# Patient Record
Sex: Male | Born: 2000 | Race: White | Hispanic: No | Marital: Single | Smoking: Never smoker
Health system: Southern US, Community
[De-identification: ages and names within clinical notes are randomized; demographics above are authoritative.]

## PROBLEM LIST (undated history)

## (undated) DIAGNOSIS — F909 Attention-deficit hyperactivity disorder, unspecified type: Secondary | ICD-10-CM

## (undated) DIAGNOSIS — E109 Type 1 diabetes mellitus without complications: Secondary | ICD-10-CM

## (undated) DIAGNOSIS — S83281A Other tear of lateral meniscus, current injury, right knee, initial encounter: Secondary | ICD-10-CM

## (undated) HISTORY — DX: Attention-deficit hyperactivity disorder, unspecified type: F90.9

## (undated) HISTORY — DX: Type 1 diabetes mellitus without complications: E10.9

---
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
## 2019-07-11 IMAGING — CR DG HAND COMPLETE 3+V*L*
3 series · 3 of 3 positions shown · non-contrast
Comparison: None.

CLINICAL DATA: 16-year-old who injured the left hand while playing
baseball. Initial encounter.

EXAM:
LEFT HAND - COMPLETE 3+ VIEW

[pa (1 of 2)]
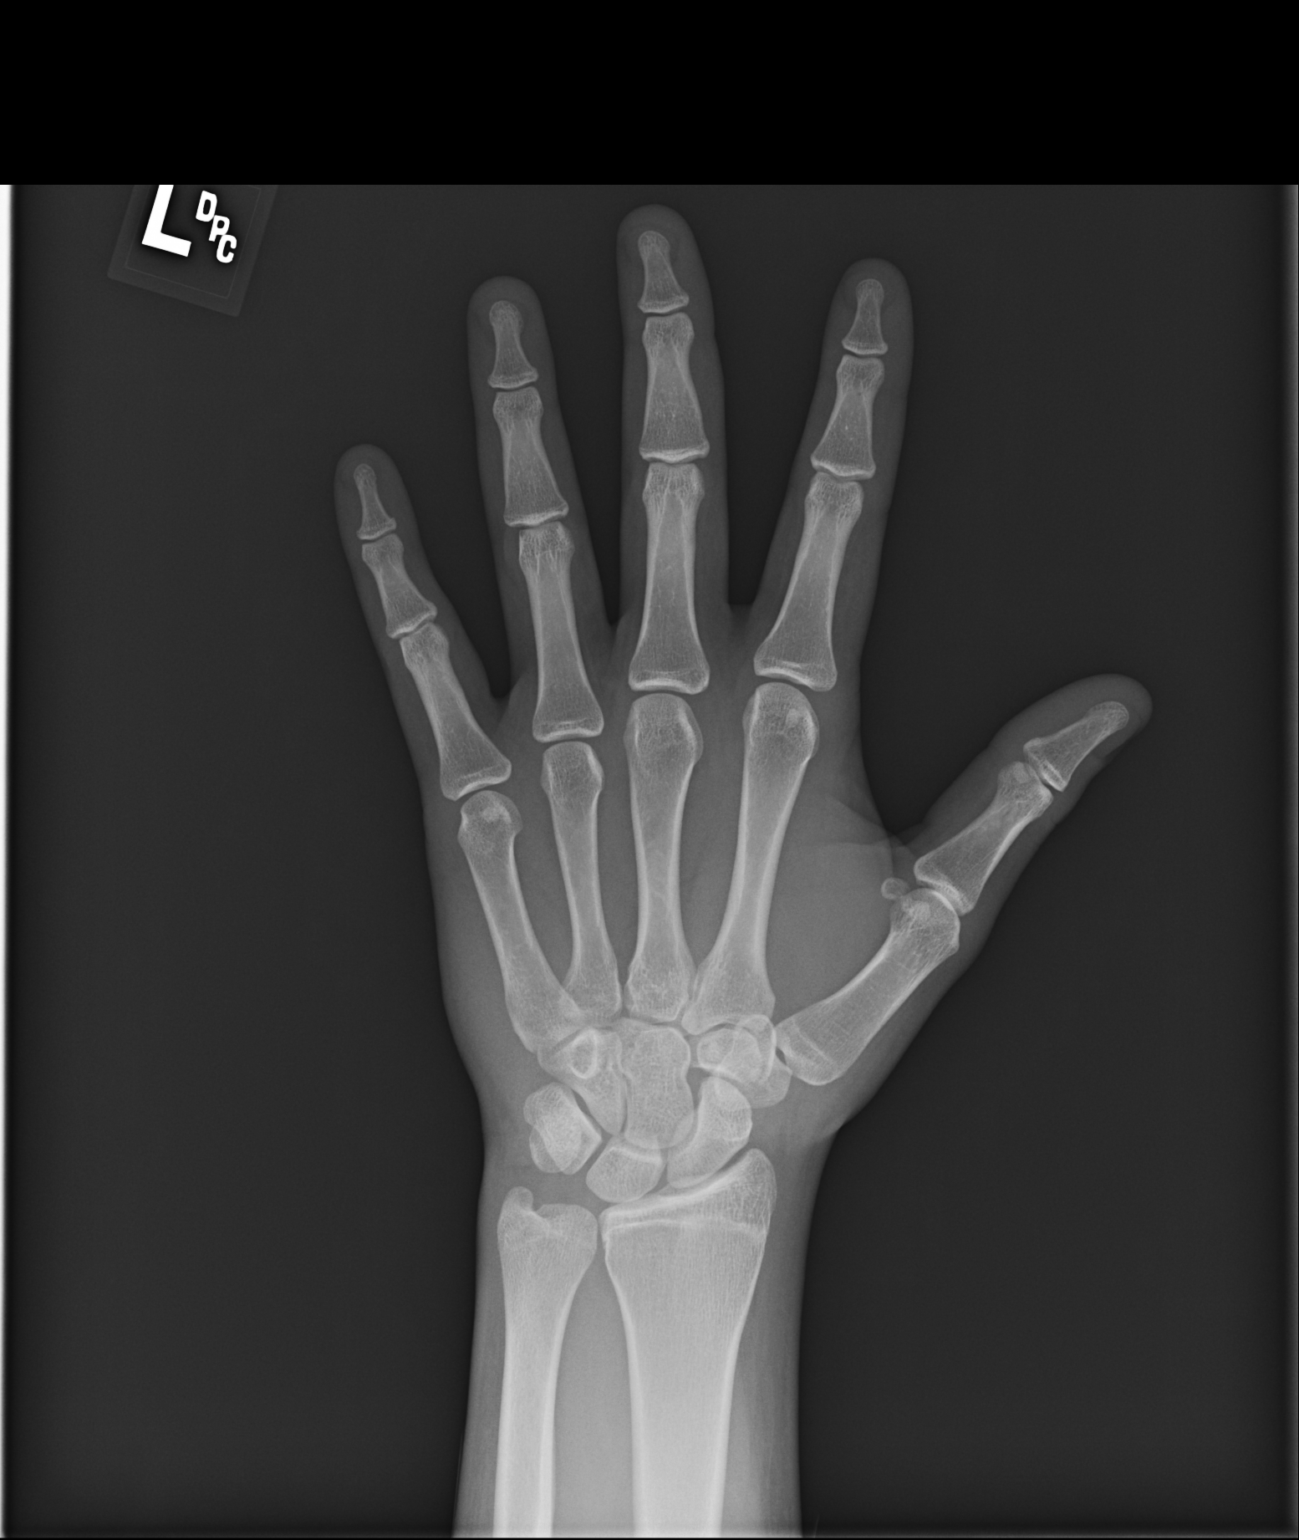

[oblique]
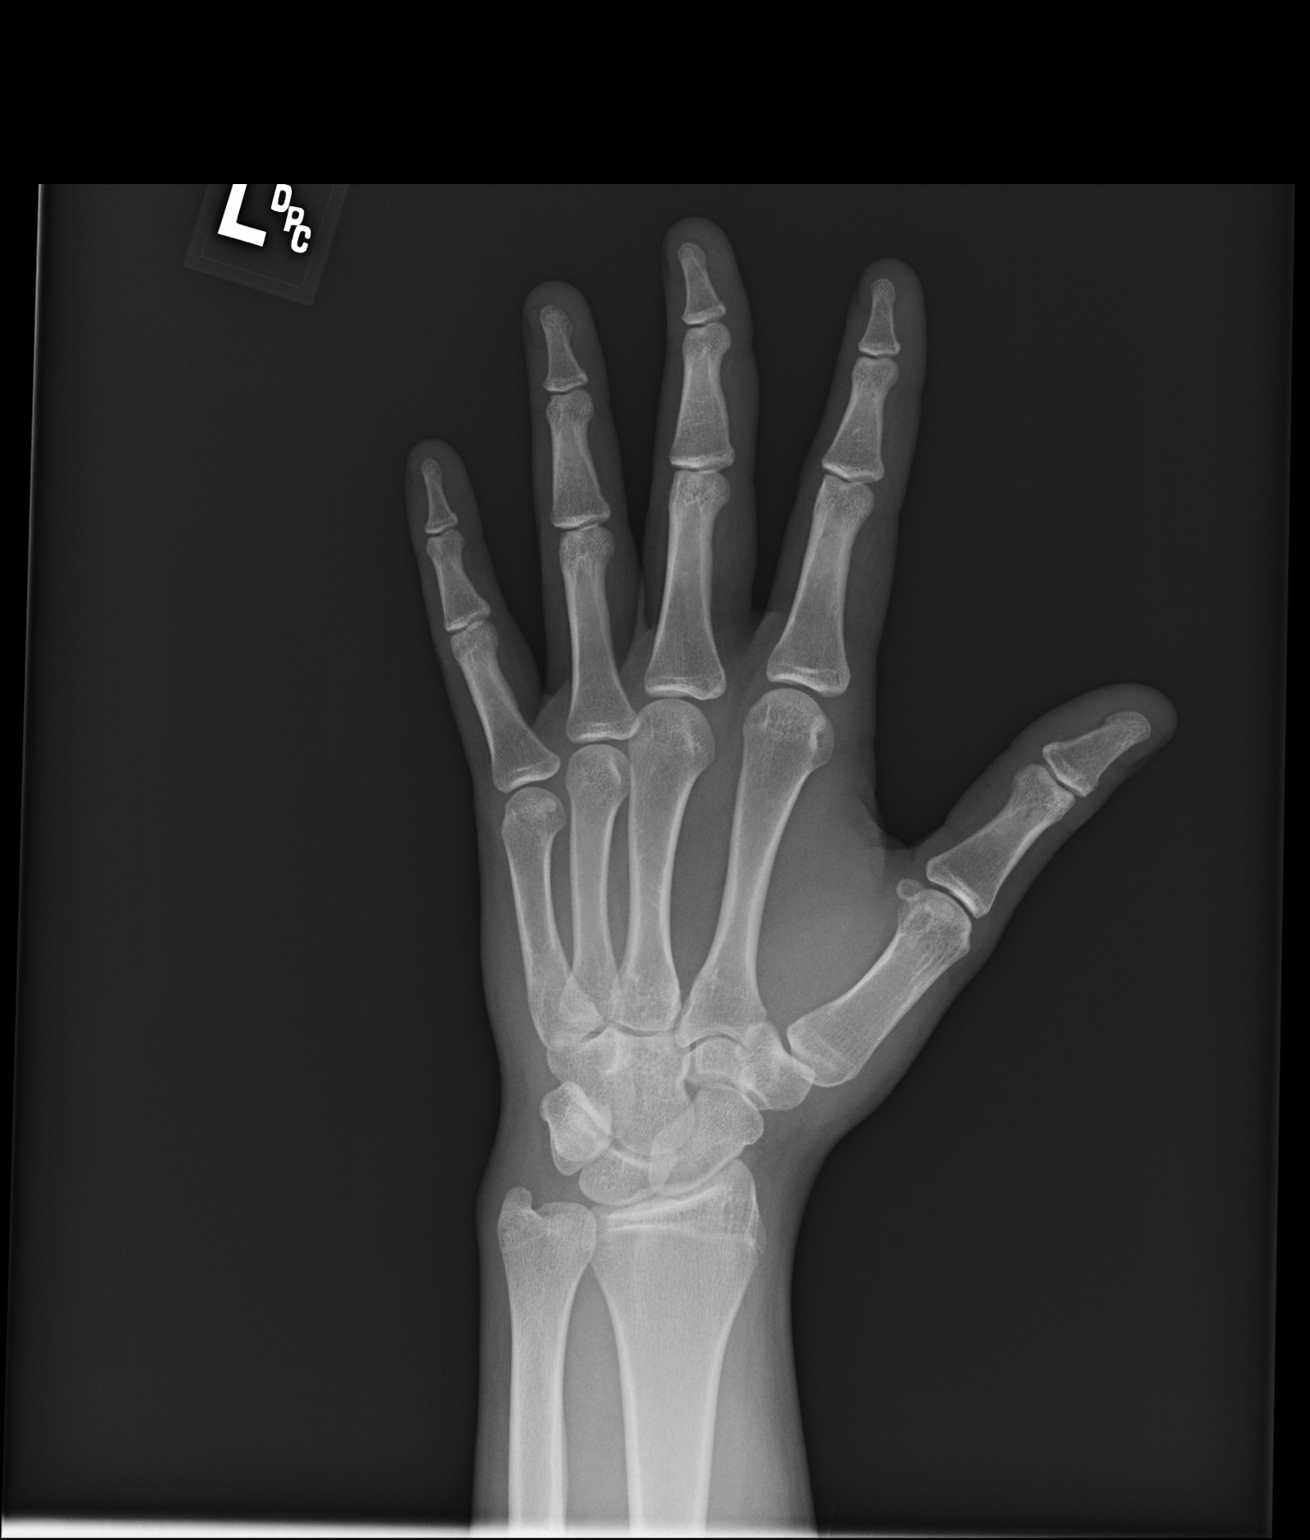

[pa (2 of 2)]
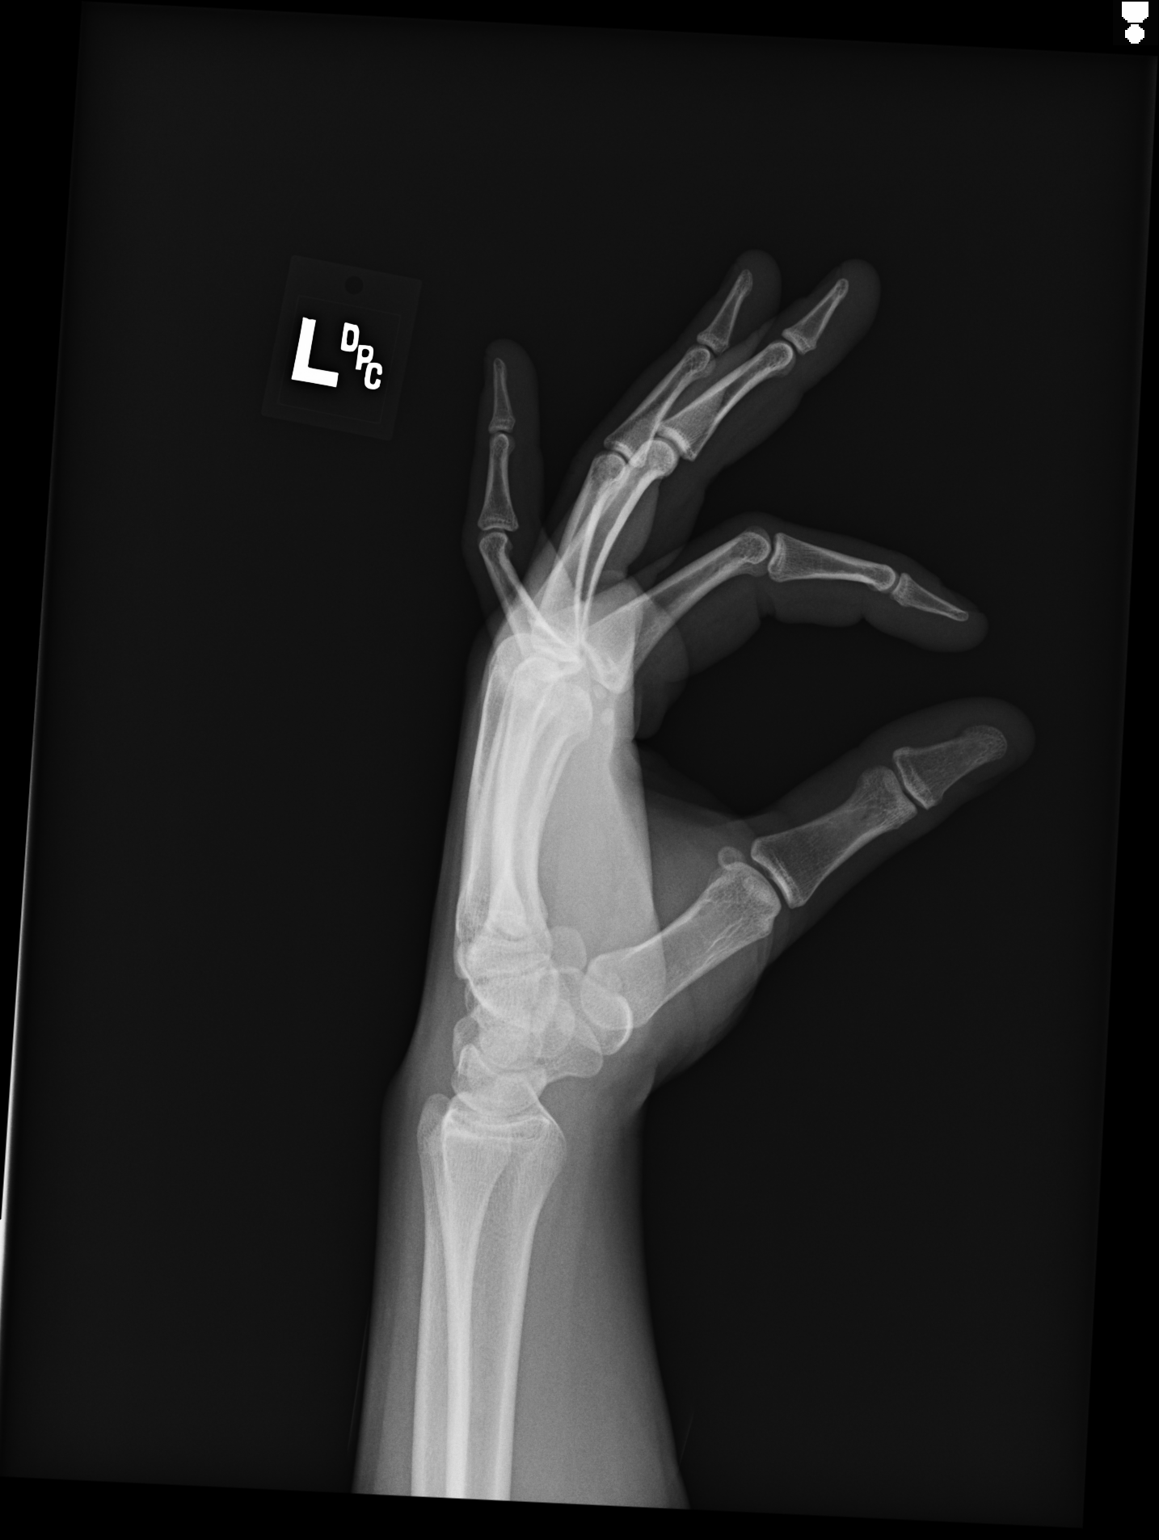

[3 of 3 positions shown; findings below may reference images not displayed]

FINDINGS: No evidence of acute fracture or dislocation. Joint spaces well
preserved. Well-preserved bone mineral density. No intrinsic osseous
abnormalities.
IMPRESSION: Normal examination.

## 2020-08-20 DIAGNOSIS — E1065 Type 1 diabetes mellitus with hyperglycemia: Secondary | ICD-10-CM | POA: Diagnosis not present

## 2020-08-20 DIAGNOSIS — Z794 Long term (current) use of insulin: Secondary | ICD-10-CM | POA: Diagnosis not present

## 2020-09-25 DIAGNOSIS — M791 Myalgia, unspecified site: Secondary | ICD-10-CM | POA: Diagnosis not present

## 2020-09-25 DIAGNOSIS — Z20822 Contact with and (suspected) exposure to covid-19: Secondary | ICD-10-CM | POA: Diagnosis not present

## 2020-10-18 DIAGNOSIS — Z20822 Contact with and (suspected) exposure to covid-19: Secondary | ICD-10-CM | POA: Diagnosis not present

## 2021-02-15 DIAGNOSIS — R509 Fever, unspecified: Secondary | ICD-10-CM | POA: Diagnosis not present

## 2021-02-15 DIAGNOSIS — J101 Influenza due to other identified influenza virus with other respiratory manifestations: Secondary | ICD-10-CM | POA: Diagnosis not present

## 2021-04-08 DIAGNOSIS — F10129 Alcohol abuse with intoxication, unspecified: Secondary | ICD-10-CM | POA: Diagnosis not present

## 2021-04-08 DIAGNOSIS — K2970 Gastritis, unspecified, without bleeding: Secondary | ICD-10-CM | POA: Diagnosis not present

## 2021-04-08 DIAGNOSIS — E1010 Type 1 diabetes mellitus with ketoacidosis without coma: Secondary | ICD-10-CM | POA: Diagnosis not present

## 2021-04-08 DIAGNOSIS — R112 Nausea with vomiting, unspecified: Secondary | ICD-10-CM | POA: Diagnosis not present

## 2021-04-08 DIAGNOSIS — Z794 Long term (current) use of insulin: Secondary | ICD-10-CM | POA: Diagnosis not present

## 2021-04-08 DIAGNOSIS — Z20822 Contact with and (suspected) exposure to covid-19: Secondary | ICD-10-CM | POA: Diagnosis not present

## 2021-04-09 DIAGNOSIS — E1010 Type 1 diabetes mellitus with ketoacidosis without coma: Secondary | ICD-10-CM | POA: Diagnosis not present

## 2021-06-19 ENCOUNTER — Encounter: Payer: Self-pay | Admitting: Nurse Practitioner

## 2021-06-19 ENCOUNTER — Other Ambulatory Visit: Payer: Self-pay

## 2021-06-19 ENCOUNTER — Ambulatory Visit (INDEPENDENT_AMBULATORY_CARE_PROVIDER_SITE_OTHER): Payer: BC Managed Care – PPO | Admitting: Nurse Practitioner

## 2021-06-19 VITALS — BP 106/70 | HR 110 | Ht 70.0 in | Wt 168.8 lb

## 2021-06-19 DIAGNOSIS — E1065 Type 1 diabetes mellitus with hyperglycemia: Secondary | ICD-10-CM

## 2021-06-19 LAB — POCT GLYCOSYLATED HEMOGLOBIN (HGB A1C): HbA1c, POC (controlled diabetic range): 10.2 % — AB (ref 0.0–7.0)

## 2021-06-19 MED ORDER — DEXCOM G7 SENSOR MISC: 1.0000 "application " | 6 refills | Status: DC

## 2021-06-19 MED ORDER — OMNIPOD 5 DEXG7G6 INTRO GEN 5 KIT: 1.0000 | PACK | Freq: Once | 0 refills | Status: AC

## 2021-06-19 MED ORDER — DEXCOM G7 RECEIVER DEVI: 1.0000 | Freq: Once | 0 refills | Status: AC

## 2021-06-19 MED ORDER — OMNIPOD 5 DEXG7G6 PODS GEN 5 MISC
1.0000 | 3 refills | Status: DC
Start: 2021-06-19 — End: 2021-09-24

## 2021-06-19 NOTE — Patient Instructions (Signed)

## 2021-06-19 NOTE — Progress Notes (Signed)
Endocrinology Consult Note       06/19/2021, 11:39 AM   Subjective:    Patient ID: Dustin Gordon, male    DOB: 03/21/2001.  Dustin Gordon is being seen in consultation for management of currently uncontrolled symptomatic diabetes requested by  Caryl Bis, MD.   Past Medical History:  Diagnosis Date   Acute lateral meniscus tear of right knee 01/07/2018   ADHD (attention deficit hyperactivity disorder)    Type 1 diabetes mellitus ([place])    diagnosed at 21 years of age    History reviewed. No pertinent surgical history.  Social History   Socioeconomic History   Marital status: Single    Spouse name: Not on file   Number of children: Not on file   Years of education: Not on file   Highest education level: Not on file  Occupational History   Not on file  Tobacco Use   Smoking status: Never    Passive exposure: Yes   Smokeless tobacco: Never  Vaping Use   Vaping Use: Never used  Substance and Sexual Activity   Alcohol use: No    Alcohol/week: 0.0 standard drinks   Drug use: No   Sexual activity: Not on file  Other Topics Concern   Not on file  Social History Narrative   Lives at home with mom, dad, brother, attends [employer] is in 8th grade.    Social Determinants of Health   Financial Resource Strain: Not on file  Food Insecurity: Not on file  Transportation Needs: Not on file  Physical Activity: Not on file  Stress: Not on file  Social Connections: Not on file    Family History  Problem Relation Age of Onset   Rheum arthritis Mother    Osteoporosis Maternal Grandmother    COPD Maternal Grandmother    Hypertension Maternal Grandfather    Kidney disease Maternal Grandfather    Heart Problems Maternal Grandfather     Outpatient Encounter Medications as of 06/19/2021  Medication Sig   Continuous Blood Gluc Receiver (DEXCOM G7 RECEIVER) DEVI Inject 1 Device into the skin  once for 1 dose.   Continuous Blood Gluc Sensor (DEXCOM G7 SENSOR) MISC Inject 1 application. into the skin as directed. Apply new sensor every 10 days as instructed   Glucagon (BAQSIMI ONE PACK) 3 MG/DOSE POWD Place into the nose.   glucagon 1 MG injection Use for Severe Hypoglycemia . Inject 1 mg intramuscularly if unresponsive, unable to swallow, unconscious and/or has seizure   insulin detemir (LEVEMIR) 100 UNIT/ML injection Inject 30 Units into the skin 2 (two) times daily.   Insulin Disposable Pump (OMNIPOD 5 G6 INTRO, GEN 5,) KIT 1 kit by Does not apply route once for 1 dose.   Insulin Disposable Pump (OMNIPOD 5 G6 POD, GEN 5,) MISC Inject 1 application. into the skin every other day. Change pod every 48-72 hours as directed   Insulin Pen Needle (B-D ULTRAFINE III SHORT PEN) 31G X 8 MM MISC Use to administer insulin 4 times daily   NOVOLOG FLEXPEN 100 UNIT/ML FlexPen USE UP TO 50 UNITS DAILY (Patient taking differently: Gives 1 unit for every 12 grams of carbs,  then takes an extra 1 unit if blood glucose is over 150.)   ACCU-CHEK FASTCLIX LANCETS MISC Use to check glucose 6 times daily (Patient not taking: Reported on 06/19/2021)   Continuous Blood Gluc Transmit (DEXCOM G6 TRANSMITTER) MISC Use as directed for continuous glucose monitoring. Reuse transmitter for 90 days then discard and replace. (Patient not taking: Reported on 06/19/2021)   glucose blood (ACCU-CHEK AVIVA) test strip Check sugar 6 x daily (Patient not taking: Reported on 06/19/2021)   glucose blood (ACCU-CHEK SMARTVIEW) test strip Check glucose 6 times daily (Patient not taking: Reported on 06/19/2021)   Insulin Glargine (LANTUS SOLOSTAR) 100 UNIT/ML Solostar Pen Use up to 50 units daily (Patient not taking: Reported on 06/19/2021)   [DISCONTINUED] Continuous Blood Gluc Sensor (DEXCOM G6 SENSOR) MISC Inject 1 sensor to the skin every 10 days for continuous glucose monitoring. (Patient not taking: Reported on 06/19/2021)   No  facility-administered encounter medications on file as of 06/19/2021.    ALLERGIES: Allergies  Allergen Reactions   Ibuprofen Other (See Comments)    Causes stomach pain    Lactose     Other reaction(s): GI Upset (intolerance)    VACCINATION STATUS:  There is no immunization history on file for this patient.  Diabetes He presents for his initial diabetic visit. He has type 1 diabetes mellitus. Onset time: Diagnosed at approx age of 43. His disease course has been fluctuating. Hypoglycemia symptoms include nervousness/anxiousness, sweats and tremors. There are no hypoglycemic complications. There are no diabetic complications. (Has had DKA in the past) Risk factors for coronary artery disease include diabetes mellitus and male sex. Current diabetic treatment includes intensive insulin program. He is compliant with treatment most of the time. His weight is stable. He is following a generally unhealthy diet. When asked about meal planning, he reported none. He has not had a previous visit with a dietitian. He rarely participates in exercise. His overall blood glucose range is 140-180 mg/dl. (He presents today for his consultation with no meter or logs to review.  His POCT A1c today is 10.2%. He does monitor glucose 3-4 times per day, has been on CGM in the past and loved it.  He has also used an insulin pump in the past and is inquiring about restarting one.  He drinks mostly water and diet soda, eats 2-3 meals per day depending on his work schedule, and occasionally eats snacks.  He does work 3rd shift and sometimes finds it difficult managing his diabetes due to the flip in schedule on his off days.  He does not engage in routine physical exercise outside of work.  He is due for eye exam, has never seen podiatrist in the past.  He is currently taking Levemir 30 units BID and Novolog 1 unit per 12 carbs.  He does report some low glucose readings in the past week.) An ACE inhibitor/angiotensin II  receptor blocker is not being taken. He does not see a podiatrist.Eye exam is not current.    Review of systems  Constitutional: + Minimally fluctuating body weight, current Body mass index is 24.22 kg/m., no fatigue, no subjective hyperthermia, no subjective hypothermia Eyes: no blurry vision, no xerophthalmia ENT: no sore throat, no nodules palpated in throat, no dysphagia/odynophagia, no hoarseness Cardiovascular: no chest pain, no shortness of breath, no palpitations, no leg swelling Respiratory: no cough, no shortness of breath Gastrointestinal: no nausea/vomiting/diarrhea Musculoskeletal: no muscle/joint aches Skin: no rashes, no hyperemia Neurological: no tremors, no numbness, no tingling, no dizziness  Psychiatric: no depression, no anxiety  Objective:     BP 106/70    Pulse (!) 110    Ht _0  (1.778 m)    Wt 168 lb 12.8 oz (76.6 kg)    SpO2 100%    BMI 24.22 kg/m   Wt Readings from Last 3 Encounters:  06/19/21 168 lb 12.8 oz (76.6 kg)  01/07/18 187 lb (84.8 kg) (90 %, Z= 1.31)*  06/18/17 215 lb (97.5 kg) (98 %, Z= 2.01)*   * Growth percentiles are based on CDC (Boys, 2-20 Years) data.     BP Readings from Last 3 Encounters:  06/19/21 106/70  01/07/18 110/66 (21 %, Z = -0.81 /  36 %, Z = -0.36)*  06/18/17 128/67 (82 %, Z = 0.92 /  42 %, Z = -0.20)*   *BP percentiles are based on the 2017 AAP Clinical Practice Guideline for boys     Physical Exam- Limited  Constitutional:  Body mass index is 24.22 kg/m. , not in acute distress, normal state of mind Eyes:  EOMI, no exophthalmos Neck: Supple Cardiovascular: RRR, no murmurs, rubs, or gallops, no edema Respiratory: Adequate breathing efforts, no crackles, rales, rhonchi, or wheezing Musculoskeletal: no gross deformities, strength intact in all four extremities, no gross restriction of joint movements Skin:  no rashes, no hyperemia Neurological: no tremor with outstretched hands    CMP ( most recent) CMP      Component Value Date/Time   NA 132 (L) 01/15/2018 1640   K 4.6 01/15/2018 1640   CL 97 (L) 01/15/2018 1640   CO2 17 (L) 01/15/2018 1640   GLUCOSE 321 (H) 01/15/2018 1640   BUN 14 01/15/2018 1640   CREATININE 0.83 01/15/2018 1640   CALCIUM 9.5 01/15/2018 1640   PROT 6.8 06/18/2017 1610   ALBUMIN 3.9 06/18/2017 1610   AST 20 06/18/2017 1610   ALT 16 (L) 06/18/2017 1610   ALKPHOS 135 06/18/2017 1610   BILITOT 1.2 06/18/2017 1610   GFRNONAA NOT CALCULATED 01/15/2018 1640   GFRAA NOT CALCULATED 01/15/2018 1640     Diabetic Labs (most recent): Lab Results  Component Value Date   HGBA1C 10.2 (A) 06/19/2021   HGBA1C 13.3 (H) 01/15/2018   HGBA1C 12.5 12/13/2014     Lipid Panel ( most recent) Lipid Panel     Component Value Date/Time   CHOL 178 (H) 09/01/2014 1256   TRIG 183 (H) 09/01/2014 1256   HDL 42 09/01/2014 1256   CHOLHDL 4.2 09/01/2014 1256   VLDL 37 09/01/2014 1256   LDLCALC 99 09/01/2014 1256      Lab Results  Component Value Date   TSH 2.303 09/01/2014   FREET4 0.99 09/01/2014           Assessment & Plan:   1) Type 1 diabetes mellitus with hyperglycemia ([place])  He presents today for his consultation with no meter or logs to review.  His POCT A1c today is 10.2%. He does monitor glucose 3-4 times per day, has been on CGM in the past and loved it.  He has also used an insulin pump in the past and is inquiring about restarting one.  He drinks mostly water and diet soda, eats 2-3 meals per day depending on his work schedule, and occasionally eats snacks.  He does work 3rd shift and sometimes finds it difficult managing his diabetes due to the flip in schedule on his off days.  He does not engage in routine physical exercise outside of work.  He  is due for eye exam, has never seen podiatrist in the past.  He is currently taking Levemir 30 units BID and Novolog 1 unit per 12 carbs.  He does report some low glucose readings in the past week.  - Dustin Gordon has  currently uncontrolled symptomatic type 2 DM since 21 years of age, with most recent A1c of 10.2 %.   -Recent labs reviewed.  - I had a long discussion with him about the progressive nature of diabetes and the pathology behind its complications. -his diabetes is not currently complicated but he remains at a high risk for more acute and chronic complications which include CAD, CVA, CKD, retinopathy, and neuropathy. These are all discussed in detail with him.  The following Lifestyle Medicine recommendations according to [facility]) were discussed and offered to patient and he agrees to start the journey:  A. Whole Foods, Plant-based plate comprising of fruits and vegetables, plant-based proteins, whole-grain carbohydrates was discussed in detail with the patient.   A list for source of those nutrients were also provided to the patient.  Patient will use only water or unsweetened tea for hydration. B.  The need to stay away from risky substances including alcohol, smoking; obtaining 7 to 9 hours of restorative sleep, at least 150 minutes of moderate intensity exercise weekly, the importance of healthy social connections,  and stress reduction techniques were discussed. C.  A full color page of  Calorie density of various food groups per pound showing examples of each food groups was provided to the patient.  - I have counseled him on diet and weight management by adopting a carbohydrate restricted/protein rich diet. Patient is encouraged to switch to unprocessed or minimally processed complex starch and increased protein intake (animal or plant source), fruits, and vegetables. -  he is advised to stick to a routine mealtimes to eat 3 meals a day and avoid unnecessary snacks (to snack only to correct hypoglycemia).   - he acknowledges that there is a room for improvement in his food and drink choices. - Suggestion is made for him to avoid simple carbohydrates from his  diet including Cakes, Sweet Desserts, Ice Cream, Soda (diet and regular), Sweet Tea, Candies, Chips, Cookies, Store Bought Juices, Alcohol in Excess of 1-2 drinks a day, Artificial Sweeteners, Coffee Creamer, and "Sugar-free" Products. This will help patient to have more stable blood glucose profile and potentially avoid unintended weight gain.  - he will be scheduled with Jearld Fenton, RDN, CDE for diabetes education.  - I have approached him with the following individualized plan to manage his diabetes and patient agrees:   -Given his diagnosis of type 1 diabetes, insulin is the exclusive form of treatment.  -He is advised to lower his Levemir to 30 units SQ nightly (instead of BID) and continue his current method of dosing his Novolog 1 unit per 12 carbs for now.  He will benefit from initiation of insulin pump given his busy schedule, I have sent in an order for Omnipod 5 and Dexcom G7 to [facility].    -he is encouraged to start monitoring glucose 4 times daily, before meals and before bed, to log their readings on the clinic sheets provided, and bring them to review at follow up appointment in 2 weeks.  I did give him a sample of Dexcom G6 today to get him started given his recent hypoglycemia.  - he is warned not to take insulin without proper monitoring per  orders. - Adjustment parameters are given to him for hypo and hyperglycemia in writing. - he is encouraged to call clinic for blood glucose levels less than 70 or above 300 mg /dl.  - Specific targets for  A1c; LDL, HDL, and Triglycerides were discussed with the patient.  2) Blood Pressure /Hypertension:  his blood pressure is controlled to target without the use of antihypertensive medications.  3) Lipids/Hyperlipidemia:    There is no recent lipid panel available to review.  Will recheck on subsequent visits.  He is not on any lipid lowering medications at this time.  4)  Weight/Diet:  his Body mass index is 24.22 kg/m.  -     he is NOT a candidate for weight loss.  Exercise, and detailed carbohydrates information provided  -  detailed on discharge instructions.  5) Chronic Care/Health Maintenance: -he is not on ACEI/ARB or Statin medications and is encouraged to initiate and continue to follow up with Ophthalmology, Dentist, Podiatrist at least yearly or according to recommendations, and advised to stay away from smoking. I have recommended yearly flu vaccine and pneumonia vaccine at least every 5 years; moderate intensity exercise for up to 150 minutes weekly; and sleep for at least 7 hours a day.  - he is advised to maintain close follow up with Caryl Bis, MD for primary care needs, as well as his other providers for optimal and coordinated care.   - Time spent in this patient care: 60 min, of which > 50% was spent in counseling him about his diabetes and the rest reviewing his blood glucose logs, discussing his hypoglycemia and hyperglycemia episodes, reviewing his current and previous labs/studies (including abstraction from other facilities) and medications doses and developing a long term treatment plan based on the latest standards of care/guidelines; and documenting his care.    Please refer to Patient Instructions for Blood Glucose Monitoring and Insulin/Medications Dosing Guide" in media tab for additional information. Please also refer to "Patient Self Inventory" in the Media tab for reviewed elements of pertinent patient history.  Dustin Gordon participated in the discussions, expressed understanding, and voiced agreement with the above plans.  All questions were answered to his satisfaction. he is encouraged to contact clinic should he have any questions or concerns prior to his return visit.     Follow up plan: - Return in about 2 weeks (around 07/03/2021) for Diabetes F/U, Bring meter and logs.    Rayetta Pigg, [facility] [address] [place], [place] [address] Phone: [phone] Fax: [phone]  06/19/2021, 11:39 AM

## 2021-06-20 ENCOUNTER — Telehealth: Payer: Self-pay

## 2021-06-20 NOTE — Telephone Encounter (Signed)
Received a fax from [facility] that this patient has been approved for the Omnipod 5 [place]. ?Effective 06/20/2021 through 06/19/2022 ?

## 2021-07-04 ENCOUNTER — Encounter: Payer: Self-pay | Admitting: Nurse Practitioner

## 2021-07-04 ENCOUNTER — Ambulatory Visit (INDEPENDENT_AMBULATORY_CARE_PROVIDER_SITE_OTHER): Payer: BC Managed Care – PPO | Admitting: Nurse Practitioner

## 2021-07-04 VITALS — BP 93/60 | HR 108 | Ht 70.0 in | Wt 173.2 lb

## 2021-07-04 DIAGNOSIS — I10 Essential (primary) hypertension: Secondary | ICD-10-CM

## 2021-07-04 DIAGNOSIS — E782 Mixed hyperlipidemia: Secondary | ICD-10-CM | POA: Diagnosis not present

## 2021-07-04 DIAGNOSIS — E1065 Type 1 diabetes mellitus with hyperglycemia: Secondary | ICD-10-CM | POA: Diagnosis not present

## 2021-07-04 DIAGNOSIS — E109 Type 1 diabetes mellitus without complications: Secondary | ICD-10-CM

## 2021-07-04 MED ORDER — BD PEN NEEDLE SHORT U/F 31G X 8 MM MISC: 11 refills | Status: DC

## 2021-07-04 NOTE — Progress Notes (Signed)
? ?                                                    Endocrinology Follow Up Note  ?     07/04/2021, 11:57 AM ? ? ?Subjective:  ? ? Patient ID: Dustin Gordon, male    DOB: June 06, 2000.  ?Dustin Gordon is being seen in follow up after being seen in consultation for management of currently uncontrolled symptomatic diabetes requested by  Richardean Chimera, MD. ? ? ?Past Medical History:  ?Diagnosis Date  ? Acute lateral meniscus tear of right knee 01/07/2018  ? ADHD (attention deficit hyperactivity disorder)   ? Type 1 diabetes mellitus (HCC)   ? diagnosed at 21 years of age  ? ? ?History reviewed. No pertinent surgical history. ? ?Social History  ? ?Socioeconomic History  ? Marital status: Single  ?  Spouse name: Not on file  ? Number of children: Not on file  ? Years of education: Not on file  ? Highest education level: Not on file  ?Occupational History  ? Not on file  ?Tobacco Use  ? Smoking status: Never  ?  Passive exposure: Yes  ? Smokeless tobacco: Never  ?Vaping Use  ? Vaping Use: Never used  ?Substance and Sexual Activity  ? Alcohol use: No  ?  Alcohol/week: 0.0 standard drinks  ? Drug use: No  ? Sexual activity: Not on file  ?Other Topics Concern  ? Not on file  ?Social History Narrative  ? Lives at home with mom, dad, brother, attends [employer] is in 8th grade.   ? ?Social Determinants of Health  ? ?Financial Resource Strain: Not on file  ?Food Insecurity: Not on file  ?Transportation Needs: Not on file  ?Physical Activity: Not on file  ?Stress: Not on file  ?Social Connections: Not on file  ? ? ?Family History  ?Problem Relation Age of Onset  ? Rheum arthritis Mother   ? Osteoporosis Maternal Grandmother   ? COPD Maternal Grandmother   ? Hypertension Maternal Grandfather   ? Kidney disease Maternal Grandfather   ? Heart Problems Maternal Grandfather   ? ? ?Outpatient Encounter Medications as of 07/04/2021  ?Medication Sig  ? Glucagon (BAQSIMI ONE PACK) 3 MG/DOSE POWD Place into  the nose.  ? glucagon 1 MG injection Use for Severe Hypoglycemia . Inject 1 mg intramuscularly if unresponsive, unable to swallow, unconscious and/or has seizure  ? insulin detemir (LEVEMIR) 100 UNIT/ML injection Inject 34 Units into the skin daily.  ? Insulin Disposable Pump (OMNIPOD 5 G6 Gordon, GEN 5,) MISC Inject 1 application. into the skin every other day. Change Gordon every 48-72 hours as directed  ? NOVOLOG FLEXPEN 100 UNIT/ML FlexPen USE UP TO 50 UNITS DAILY (Patient taking differently: Gives 1 unit for every 12 grams of carbs, then takes an extra 1 unit if blood glucose is over 150.)  ? [DISCONTINUED] Insulin Pen Needle (B-D ULTRAFINE III SHORT PEN) 31G X 8 MM MISC Use to administer insulin 4 times daily  ? ACCU-CHEK FASTCLIX LANCETS MISC Use to check glucose 6 times daily (Patient not taking: Reported on 06/19/2021)  ? Continuous Blood Gluc Sensor (DEXCOM G7 SENSOR) MISC Inject 1 application. into the skin as directed. Apply new sensor every 10 days as instructed (Patient not taking: Reported on 07/04/2021)  ?  Continuous Blood Gluc Transmit (DEXCOM G6 TRANSMITTER) MISC Use as directed for continuous glucose monitoring. Reuse transmitter for 90 days then discard and replace. (Patient not taking: Reported on 06/19/2021)  ? Insulin Pen Needle (B-D ULTRAFINE III SHORT PEN) 31G X 8 MM MISC Use to administer insulin 4 times daily  ? [DISCONTINUED] glucose blood (ACCU-CHEK AVIVA) test strip Check sugar 6 x daily (Patient not taking: Reported on 06/19/2021)  ? [DISCONTINUED] glucose blood (ACCU-CHEK SMARTVIEW) test strip Check glucose 6 times daily (Patient not taking: Reported on 06/19/2021)  ? ?No facility-administered encounter medications on file as of 07/04/2021.  ? ? ?ALLERGIES: ?Allergies  ?Allergen Reactions  ? Ibuprofen Other (See Comments)  ?  Causes stomach pain ?  ? Lactose   ?  Other reaction(s): GI Upset (intolerance)  ? ? ?VACCINATION STATUS: ? ?There is no immunization history on file for this  patient. ? ?Diabetes ?He presents for his follow-up diabetic visit. He has type 1 diabetes mellitus. Onset time: Diagnosed at approx age of 48. His disease course has been improving. There are no hypoglycemic associated symptoms. (Hypoglycemia has greatly improved since lowering his basal insulin) There are no hypoglycemic complications. There are no diabetic complications. (Has had DKA in the past) Risk factors for coronary artery disease include diabetes mellitus and male sex. Current diabetic treatment includes intensive insulin program. He is compliant with treatment most of the time. His weight is fluctuating minimally. He is following a generally unhealthy diet. When asked about meal planning, he reported none. He has not had a previous visit with a dietitian. He rarely participates in exercise. His overall blood glucose range is 140-180 mg/dl. (He presents today with his CGM, meter showing at goal fasting and above target postprandial glycemic profile.  He was not due for another A1c today.  He loves using his Dexcom but has not yet picked up the prescription.  He was told his Omnipod would cost him over $800 per month so he has not moved forward with it.  He does have a new T-Slim pump that he never started at his home as well.  He wants to talk with his insurance company before moving forward with either pump.  Analysis of his CGM shows TIR 41%, TAR 59%, TBR 0%.  He did have a drop after a meal as he calculated his carbs wrong.) An ACE inhibitor/angiotensin II receptor blocker is not being taken. He does not see a podiatrist.Eye exam is not current.  ? ?Review of systems ? ?Constitutional: + Minimally fluctuating body weight,  current Body mass index is 24.85 kg/m?. , no fatigue, no subjective hyperthermia, no subjective hypothermia ?Eyes: no blurry vision, no xerophthalmia ?ENT: no sore throat, no nodules palpated in throat, no dysphagia/odynophagia, no hoarseness ?Cardiovascular: no chest pain, no  shortness of breath, no palpitations, no leg swelling ?Respiratory: no cough, no shortness of breath ?Gastrointestinal: no nausea/vomiting/diarrhea ?Musculoskeletal: no muscle/joint aches ?Skin: no rashes, no hyperemia ?Neurological: no tremors, no numbness, no tingling, no dizziness ?Psychiatric: no depression, no anxiety ? ?Objective:  ?  ? ?BP 93/60   Pulse (!) 108   Ht 5\' 10"  (1.778 m)   Wt 173 lb 3.2 oz (78.6 kg)   SpO2 100%   BMI 24.85 kg/m?   ?Wt Readings from Last 3 Encounters:  ?07/04/21 173 lb 3.2 oz (78.6 kg)  ?06/19/21 168 lb 12.8 oz (76.6 kg)  ?01/07/18 187 lb (84.8 kg) (90 %, Z= 1.31)*  ? ?* Growth percentiles are based on CDC (  Boys, 2-20 Years) data.  ?  ? ?BP Readings from Last 3 Encounters:  ?07/04/21 93/60  ?06/19/21 106/70  ?01/07/18 110/66 (21 %, Z = -0.81 /  36 %, Z = -0.36)*  ? ?*BP percentiles are based on the 2017 AAP Clinical Practice Guideline for boys  ?  ? ?Physical Exam- Limited ? ?Constitutional:  Body mass index is 24.85 kg/m?. , not in acute distress, normal state of mind ?Eyes:  EOMI, no exophthalmos ?Cardiovascular: RRR, no murmurs, rubs, or gallops, no edema ?Respiratory: Adequate breathing efforts, no crackles, rales, rhonchi, or wheezing ?Musculoskeletal: no gross deformities, strength intact in all four extremities, no gross restriction of joint movements ?Skin:  no rashes, no hyperemia ?Neurological: no tremor with outstretched hands ? ? ? ?CMP ( most recent) ?CMP  ?   ?Component Value Date/Time  ? NA 132 (L) 01/15/2018 1640  ? K 4.6 01/15/2018 1640  ? CL 97 (L) 01/15/2018 1640  ? CO2 17 (L) 01/15/2018 1640  ? GLUCOSE 321 (H) 01/15/2018 1640  ? BUN 14 01/15/2018 1640  ? CREATININE 0.83 01/15/2018 1640  ? CALCIUM 9.5 01/15/2018 1640  ? PROT 6.8 06/18/2017 1610  ? ALBUMIN 3.9 06/18/2017 1610  ? AST 20 06/18/2017 1610  ? ALT 16 (L) 06/18/2017 1610  ? ALKPHOS 135 06/18/2017 1610  ? BILITOT 1.2 06/18/2017 1610  ? GFRNONAA NOT CALCULATED 01/15/2018 1640  ? GFRAA NOT CALCULATED  01/15/2018 1640  ? ? ? ?Diabetic Labs (most recent): ?Lab Results  ?Component Value Date  ? HGBA1C 10.2 (A) 06/19/2021  ? HGBA1C 13.3 (H) 01/15/2018  ? HGBA1C 12.5 12/13/2014  ? ? ? Lipid Panel ( most recent) ?Lipid Panel  ?   ?Com

## 2021-07-04 NOTE — Patient Instructions (Signed)
Diabetes Mellitus and Skin Care Diabetes, also called diabetes mellitus, can lead to skin problems. People with diabetes have a higher risk for many types of skin complications because poorly controlled blood sugar (glucose) levels can cause problems over time. These problems include: Damage to nerves. This can affect your ability to feel wounds, which means you may not notice minor skin injuries that could lead to serious problems. This can also decrease the amount that you sweat, causing dry skin that can break down. Damage to blood vessels. The lack of blood flow can cause skin to break down and can slow healing time, which can lead to infections. Areas of skin that become thick or discolored. Common skin conditions There are certain skin conditions that commonly affect people who have diabetes, such as: Dry skin. Thin skin. The skin on the feet may get thinner, break more easily, and heal more slowly than normal. Bacterial skin infections, including: Styes. Boils. Infected hair follicles. Infections of the skin around the nails. Fungal skin infections. These are most common in areas where skin rubs together, such as in the armpits or under the breasts. Common skin changes Diabetes can also cause the skin to change. You may develop: Dark, velvety markings on the skin that usually appear on the face, neck, armpits, inner thighs, and groin. Red, raised, scar-like tissue that may itch, feel painful, or develop into a wound. Blisters on the feet, toes, hands, or fingers. Thickened, wax-like areas of skin that usually occur on the hands, forehead, or toes. Brown or red, ring-shaped or half-ring-shaped patches of skin on the ears or fingers. Pea-shaped, yellow bumps that may be itchy and surrounded by a red ring. This usually affects the arms, feet, buttocks, and the top of the hands. Round, discolored patches of tan skin that do not hurt or itch. These may look like age spots. General  tips Most skin problems can be prevented or treated easily if caught early. Talk with your health care provider if you have any concerns. General tips usually include: Check your skin every day for cuts, bruises, redness, blisters, or sores, especially on your feet. Tell your health care provider about any cuts, wounds, or sores that you have and if they are healing slowly. Keep your skin clean and dry. Do not use hot water. Moisturize your skin to prevent chapping. Do not scratch dry skin. Scratching dry skin can expose it to infection. Keep your blood glucose levels within target range. Supplies needed: Mild soap or gentle skin cleanser. Lotion. How to care for dry itchy skin It is common for people with diabetes to have itchy skin caused by dryness. Frequent high glucose levels can cause itchiness, and poor circulation and certain skin infections can make dry, itchy skin worse. If you have itchy skin that is red or covered in a rash, this could be a sign of an allergic reaction. If you have a rash or if your skin is very itchy, contact your health care provider. You may need help to manage your diabetes better, or you may need treatment for an infection. Untreated fungal infections can be dangerous because they allow more serious bacterial infections to enter the body. To relieve dry skin and itching: Avoid very hot showers and baths. Use mild soap and gentle skin cleansers. Do not use soap that is perfumed or harsh or that dries your skin. Moisturizing soaps may help. Put on moisturizing lotion as soon as you finish bathing. Follow these instructions at home:    Schedule a foot exam with your health care provider once every year. This exam includes an inspection of the structure and skin of your feet. Make sure that your health care provider performs a visual foot exam at every medical visit. If you get a skin injury, such as a cut, blister, or sore, check the area every day for signs of  infection. Check for: Redness, swelling, or pain. Fluid or blood. Warmth. Pus or a bad smell. Do not use any products that contain nicotine or tobacco. These products include cigarettes, chewing tobacco, and vaping devices, such as e-cigarettes. If you need help quitting, ask your health care provider. Take over-the-counter and prescription medicines only as told by your health care provider. This includes all diabetes medicines you are taking. Where to find more information American Diabetes Association: www.diabetes.org Association of Diabetes Care & Education Specialists: www.diabeteseducator.org Contact a health care provider if you: Develop a cut or sore, especially on your feet. Develop signs of infection after a skin injury. Have itchy skin that develops redness or a rash. Have discolored areas of skin. Have areas where your skin is changing, such as thickening or appearing shiny. Summary People with diabetes have a higher risk for many types of skin complications. This is because poorly controlled blood sugar (glucose) levels can cause skin problems over time. Most skin problems can be prevented or treated easily if caught early. Keep your blood glucose levels within target range. Check your skin every day for cuts, bruises, redness, blisters, or sores, especially on your feet. Tell your health care provider about any cuts, wounds, or sores that you have and if they are healing slowly. This information is not intended to replace advice given to you by your health care provider. Make sure you discuss any questions you have with your health care provider. Document Revised: 10/20/2019 Document Reviewed: 10/20/2019 Elsevier Patient Education  2022 Elsevier Inc.  

## 2021-07-08 ENCOUNTER — Telehealth: Payer: Self-pay

## 2021-07-08 NOTE — Telephone Encounter (Signed)
Called patient to let him know that we received a fax from [facility] and he stated that his insurance does not cover the Dexcom or the Omnipod. I advised for patient to contact his insurance and see what insulin pump they cover and also what CGM they cover and what pharmacies would be the best to use to get his supplies. Patient verbalized an understanding and will let us know. ?

## 2021-07-11 ENCOUNTER — Ambulatory Visit: Payer: No Typology Code available for payment source | Admitting: Nutrition

## 2021-09-24 ENCOUNTER — Other Ambulatory Visit: Payer: Self-pay | Admitting: Nurse Practitioner

## 2021-09-24 MED ORDER — OMNIPOD 5 DEXG7G6 PODS GEN 5 MISC: 1.0000 "application " | 3 refills | Status: DC

## 2021-09-25 ENCOUNTER — Other Ambulatory Visit: Payer: Self-pay | Admitting: Nurse Practitioner

## 2021-09-25 ENCOUNTER — Telehealth: Payer: Self-pay | Admitting: Nurse Practitioner

## 2021-09-25 DIAGNOSIS — E1065 Type 1 diabetes mellitus with hyperglycemia: Secondary | ICD-10-CM

## 2021-09-25 MED ORDER — INSULIN DETEMIR 100 UNIT/ML ~~LOC~~ SOLN: 34.0000 [IU] | Freq: Every day | SUBCUTANEOUS | 0 refills | Status: DC

## 2021-09-25 NOTE — Telephone Encounter (Signed)
Rx refill sent.

## 2021-09-25 NOTE — Telephone Encounter (Signed)
Pt said that [facility] told him that we denied his Levemir. I do not see that. He said that he needs a prescription sent into them. Thank you

## 2021-10-02 DIAGNOSIS — T24202A Burn of second degree of unspecified site of left lower limb, except ankle and foot, initial encounter: Secondary | ICD-10-CM | POA: Diagnosis not present

## 2021-10-04 ENCOUNTER — Ambulatory Visit: Payer: BC Managed Care – PPO | Admitting: Nurse Practitioner

## 2021-10-04 DIAGNOSIS — E1065 Type 1 diabetes mellitus with hyperglycemia: Secondary | ICD-10-CM

## 2021-10-11 ENCOUNTER — Ambulatory Visit (INDEPENDENT_AMBULATORY_CARE_PROVIDER_SITE_OTHER): Payer: BC Managed Care – PPO | Admitting: Nurse Practitioner

## 2021-10-11 ENCOUNTER — Encounter: Payer: Self-pay | Admitting: Nurse Practitioner

## 2021-10-11 VITALS — BP 109/70 | HR 99 | Temp 98.7°F | Ht 70.0 in | Wt 177.0 lb

## 2021-10-11 DIAGNOSIS — Z8659 Personal history of other mental and behavioral disorders: Secondary | ICD-10-CM | POA: Diagnosis not present

## 2021-10-11 DIAGNOSIS — Z0000 Encounter for general adult medical examination without abnormal findings: Secondary | ICD-10-CM

## 2021-10-11 NOTE — Progress Notes (Signed)
New Patient Note  RE: Boden Stucky MRN: [id number] DOB: 07-10-2000 Date of Office Visit: 10/11/2021  Chief Complaint: Annual Exam  History of Present Illness: .   Encounter for general adult medical examination Physical: Patient's last physical exam was 1 year ago .  Weight: Appropriate for height (BMI less than 27%) ;  Blood Pressure: Normal (BP less than 120/80) ;  Medical History: Patient history reviewed ; Family history reviewed ;  Allergies Reviewed: No change in current allergies ;  Medications Reviewed: Medications reviewed - no changes ;  Lipids: Normal lipid levels : completed with endocrinologist Smoking: Life-long non-smoker  Physical Activity: Exercises at least 3 times per week ;  Alcohol/Drug Use: Is a social-drinker ; No illicit drug use ;  Patient is not afflicted from Stress Incontinence and Urge Incontinence  Safety: reviewed ; Patient wears a seat belt, has smoke detectors, has carbon monoxide detectors and wears sunscreen with extended sun exposure. Dental Care: biannual cleanings, brushes and flosses daily. Ophthalmology/Optometry: Annual visit.  Hearing loss: none Vision impairments: none   Assessment and Plan: Kemp is a 21 y.o. male establishing care Annual physical exam Completed annual physical exam, patient has no new concerns, patient is a type one diabetic and he is currently managed by his endocrinologist. Patient also mentioned that he was diagnosed with ADHD at an early age, he reports not having had Adderral since 2019 but would like to go back on medication to help him focus and perform better at work. I provided education to patient that since 4 years has elapsed since he took adderral and now as an adult he would have to be evaluated by Pschiatry I advised patient to call for an appointment or I would place an appoint for patient. Patient verbalized understanding. And knows to follow up .  Paper work for completing medical report for [employer]  licence will be completed to be picked up by patient on Monday or wednesday of next week.  Return if symptoms worsen or fail to improve.   Diagnostics:   Past Medical History: Patient Active Problem List   Diagnosis Date Noted   Annual physical exam 10/11/2021   Acute lateral meniscus tear of right knee 01/07/2018   Type I diabetes mellitus, uncontrolled 09/27/2014   Acanthosis nigricans 09/27/2014   Type 1 diabetes mellitus (HCC) 09/01/2014   Past Medical History:  Diagnosis Date   Acute lateral meniscus tear of right knee 01/07/2018   ADHD (attention deficit hyperactivity disorder)    Type 1 diabetes mellitus (HCC)    diagnosed at 21 years of age   Past Surgical History: History reviewed. No pertinent surgical history. Medication List:  Current Outpatient Medications  Medication Sig Dispense Refill   ACCU-CHEK FASTCLIX LANCETS MISC Use to check glucose 6 times daily 300 each 3   Glucagon (BAQSIMI ONE PACK) 3 MG/DOSE POWD Place into the nose.     insulin detemir (LEVEMIR FLEXTOUCH) 100 UNIT/ML FlexPen Inject 34 Units into the skin at bedtime. 30 mL 5   insulin detemir (LEVEMIR) 100 UNIT/ML injection Inject 0.34 mLs (34 Units total) into the skin daily. 10 mL 0   Insulin Pen Needle (B-D ULTRAFINE III SHORT PEN) 31G X 8 MM MISC Use to administer insulin 4 times daily 200 each 11   NOVOLOG FLEXPEN 100 UNIT/ML FlexPen USE UP TO 50 UNITS DAILY (Patient taking differently: Gives 1 unit for every 12 grams of carbs, then takes an extra 1 unit if blood glucose is over 150.)  15 mL 6   SSD 1 % cream SMARTSIG:Inch(es) Topical Twice Daily PRN     No current facility-administered medications for this visit.   Allergies: Allergies  Allergen Reactions   Ibuprofen Other (See Comments)    Causes stomach pain    Lactose     Other reaction(s): GI Upset (intolerance)   Social History: Social History   Socioeconomic History   Marital status: Single    Spouse name: Not on file   Number  of children: Not on file   Years of education: Not on file   Highest education level: Not on file  Occupational History   Not on file  Tobacco Use   Smoking status: Never    Passive exposure: Yes   Smokeless tobacco: Current    Types: Chew  Vaping Use   Vaping Use: Never used  Substance and Sexual Activity   Alcohol use: Yes   Drug use: No   Sexual activity: Yes  Other Topics Concern   Not on file  Social History Narrative   Lives at home with mom, dad, brother, attends [employer] is in 8th grade.    Social Determinants of Health   Financial Resource Strain: Not on file  Food Insecurity: Not on file  Transportation Needs: Not on file  Physical Activity: Not on file  Stress: Not on file  Social Connections: Not on file       Family History: Family History  Problem Relation Age of Onset   Arthritis Mother    Rheum arthritis Mother    Osteoporosis Maternal Grandmother    COPD Maternal Grandmother    Hypertension Maternal Grandfather    Kidney disease Maternal Grandfather    Heart Problems Maternal Grandfather          Review of Systems  Constitutional: Negative.   HENT: Negative.    Eyes: Negative.   Respiratory: Negative.    Cardiovascular: Negative.   Genitourinary: Negative.   Musculoskeletal: Negative.   Skin: Negative.   Neurological: Negative.   All other systems reviewed and are negative.  Objective: BP 109/70   Pulse 99   Temp 98.7 F (37.1 C)   Ht 5\' 10"  (1.778 m)   Wt 177 lb (80.3 kg)   SpO2 98%   BMI 25.40 kg/m  Body mass index is 25.4 kg/m. Physical Exam Vitals and nursing note reviewed.  Constitutional:      Appearance: Normal appearance.  HENT:     Head: Normocephalic.     Right Ear: External ear normal.     Left Ear: External ear normal.     Nose: Nose normal.     Mouth/Throat:     Mouth: Mucous membranes are moist.     Pharynx: Oropharynx is clear.  Eyes:     Conjunctiva/sclera: Conjunctivae normal.   Cardiovascular:     Rate and Rhythm: Normal rate and regular rhythm.     Pulses: Normal pulses.     Heart sounds: Normal heart sounds.  Pulmonary:     Effort: Pulmonary effort is normal.     Breath sounds: Normal breath sounds.  Abdominal:     General: Bowel sounds are normal.  Skin:    General: Skin is warm.     Findings: No rash.  Neurological:     General: No focal deficit present.     Mental Status: He is alert and oriented to person, place, and time.  Psychiatric:        Mood and Affect: Mood  normal.        Behavior: Behavior normal.    The plan was reviewed with the patient/family, and all questions/concerned were addressed.  It was my pleasure to see Dustin Gordon today and participate in his care. Please feel free to contact me with any questions or concerns.  Sincerely,  Lynnell Chad NP [facility]

## 2021-10-11 NOTE — Patient Instructions (Signed)
Health Maintenance, Male Adopting a healthy lifestyle and getting preventive care are important in promoting health and wellness. Ask your health care provider about: The right schedule for you to have regular tests and exams. Things you can do on your own to prevent diseases and keep yourself healthy. What should I know about diet, weight, and exercise? Eat a healthy diet  Eat a diet that includes plenty of vegetables, fruits, low-fat dairy products, and lean protein. Do not eat a lot of foods that are high in solid fats, added sugars, or sodium. Maintain a healthy weight Body mass index (BMI) is a measurement that can be used to identify possible weight problems. It estimates body fat based on height and weight. Your health care provider can help determine your BMI and help you achieve or maintain a healthy weight. Get regular exercise Get regular exercise. This is one of the most important things you can do for your health. Most adults should: Exercise for at least 150 minutes each week. The exercise should increase your heart rate and make you sweat (moderate-intensity exercise). Do strengthening exercises at least twice a week. This is in addition to the moderate-intensity exercise. Spend less time sitting. Even light physical activity can be beneficial. Watch cholesterol and blood lipids Have your blood tested for lipids and cholesterol at 22 years of age, then have this test every 5 years. You may need to have your cholesterol levels checked more often if: Your lipid or cholesterol levels are high. You are older than 22 years of age. You are at high risk for heart disease. What should I know about cancer screening? Many types of cancers can be detected early and may often be prevented. Depending on your health history and family history, you may need to have cancer screening at various ages. This may include screening for: Colorectal cancer. Prostate cancer. Skin cancer. Lung  cancer. What should I know about heart disease, diabetes, and high blood pressure? Blood pressure and heart disease High blood pressure causes heart disease and increases the risk of stroke. This is more likely to develop in people who have high blood pressure readings or are overweight. Talk with your health care provider about your target blood pressure readings. Have your blood pressure checked: Every 3-5 years if you are 18-39 years of age. Every year if you are 40 years old or older. If you are between the ages of 65 and 75 and are a current or former smoker, ask your health care provider if you should have a one-time screening for abdominal aortic aneurysm (AAA). Diabetes Have regular diabetes screenings. This checks your fasting blood sugar level. Have the screening done: Once every three years after age 45 if you are at a normal weight and have a low risk for diabetes. More often and at a younger age if you are overweight or have a high risk for diabetes. What should I know about preventing infection? Hepatitis B If you have a higher risk for hepatitis B, you should be screened for this virus. Talk with your health care provider to find out if you are at risk for hepatitis B infection. Hepatitis C Blood testing is recommended for: Everyone born from 1945 through 1965. Anyone with known risk factors for hepatitis C. Sexually transmitted infections (STIs) You should be screened each year for STIs, including gonorrhea and chlamydia, if: You are sexually active and are younger than 22 years of age. You are older than 22 years of age and your   health care provider tells you that you are at risk for this type of infection. Your sexual activity has changed since you were last screened, and you are at increased risk for chlamydia or gonorrhea. Ask your health care provider if you are at risk. Ask your health care provider about whether you are at high risk for HIV. Your health care provider  may recommend a prescription medicine to help prevent HIV infection. If you choose to take medicine to prevent HIV, you should first get tested for HIV. You should then be tested every 3 months for as long as you are taking the medicine. Follow these instructions at home: Alcohol use Do not drink alcohol if your health care provider tells you not to drink. If you drink alcohol: Limit how much you have to 0-2 drinks a day. Know how much alcohol is in your drink. In the [place] one drink equals one 12 oz bottle of beer (355 mL), one 5 oz glass of wine (148 mL), or one 1 oz glass of hard liquor (44 mL). Lifestyle Do not use any products that contain nicotine or tobacco. These products include cigarettes, chewing tobacco, and vaping devices, such as e-cigarettes. If you need help quitting, ask your health care provider. Do not use street drugs. Do not share needles. Ask your health care provider for help if you need support or information about quitting drugs. General instructions Schedule regular health, dental, and eye exams. Stay current with your vaccines. Tell your health care provider if: You often feel depressed. You have ever been abused or do not feel safe at home. Summary Adopting a healthy lifestyle and getting preventive care are important in promoting health and wellness. Follow your health care provider's instructions about healthy diet, exercising, and getting tested or screened for diseases. Follow your health care provider's instructions on monitoring your cholesterol and blood pressure. This information is not intended to replace advice given to you by your health care provider. Make sure you discuss any questions you have with your health care provider. Document Revised: 08/20/2020 Document Reviewed: 08/20/2020 Elsevier Patient Education  2023 Elsevier Inc.  

## 2021-10-11 NOTE — Assessment & Plan Note (Signed)
Completed annual physical exam, patient has no new concerns, patient is a type one diabetic and he is currently managed by his endocrinologist. Patient also mentioned that he was diagnosed with ADHD at an early age, he reports not having had Adderral since 2019 but would like to go back on medication to help him focus and perform better at work. I provided education to patient that since 4 years has elapsed since he took adderral and now as an adult he would have to be evaluated by Pschiatry I advised patient to call for an appointment or I would place an appoint for patient. Patient verbalized understanding. And knows to follow up .  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

## 2021-11-04 ENCOUNTER — Other Ambulatory Visit: Payer: Self-pay | Admitting: Nurse Practitioner

## 2021-11-25 ENCOUNTER — Ambulatory Visit: Payer: BC Managed Care – PPO | Admitting: Nurse Practitioner

## 2021-12-24 ENCOUNTER — Telehealth (HOSPITAL_COMMUNITY): Payer: BC Managed Care – PPO | Admitting: Psychiatry

## 2022-01-11 ENCOUNTER — Other Ambulatory Visit: Payer: Self-pay | Admitting: Nurse Practitioner

## 2022-01-16 ENCOUNTER — Telehealth: Payer: Self-pay | Admitting: Nurse Practitioner

## 2022-01-17 MED ORDER — NOVOLOG FLEXPEN 100 UNIT/ML ~~LOC~~ SOPN: 1.0000 [IU] | PEN_INJECTOR | Freq: Three times a day (TID) | SUBCUTANEOUS | 0 refills | Status: DC

## 2022-01-17 NOTE — Addendum Note (Signed)
Addended by: Brita Romp on: 01/17/2022 11:20 AM   Modules accepted: Orders

## 2022-01-17 NOTE — Telephone Encounter (Signed)
Pt requesting a refill. Last OV 3/23, he is scheduled to see you Tuesday. Can you call him enough to get to appt? [facility]

## 2022-01-17 NOTE — Telephone Encounter (Signed)
done

## 2022-01-20 DIAGNOSIS — E1369 Other specified diabetes mellitus with other specified complication: Secondary | ICD-10-CM | POA: Diagnosis not present

## 2022-01-20 DIAGNOSIS — R600 Localized edema: Secondary | ICD-10-CM | POA: Diagnosis not present

## 2022-01-20 DIAGNOSIS — M009 Pyogenic arthritis, unspecified: Secondary | ICD-10-CM | POA: Diagnosis not present

## 2022-01-20 DIAGNOSIS — Z794 Long term (current) use of insulin: Secondary | ICD-10-CM | POA: Diagnosis not present

## 2022-01-21 ENCOUNTER — Other Ambulatory Visit: Payer: Self-pay

## 2022-01-21 ENCOUNTER — Ambulatory Visit: Payer: BC Managed Care – PPO | Admitting: Nurse Practitioner

## 2022-01-21 ENCOUNTER — Ambulatory Visit (INDEPENDENT_AMBULATORY_CARE_PROVIDER_SITE_OTHER): Payer: BC Managed Care – PPO

## 2022-01-21 ENCOUNTER — Encounter: Payer: Self-pay | Admitting: Family Medicine

## 2022-01-21 ENCOUNTER — Ambulatory Visit (INDEPENDENT_AMBULATORY_CARE_PROVIDER_SITE_OTHER): Payer: BC Managed Care – PPO | Admitting: Family Medicine

## 2022-01-21 ENCOUNTER — Telehealth: Payer: Self-pay

## 2022-01-21 VITALS — BP 120/71 | HR 106 | Temp 98.8°F | Ht 70.0 in | Wt 179.0 lb

## 2022-01-21 DIAGNOSIS — M25522 Pain in left elbow: Secondary | ICD-10-CM

## 2022-01-21 DIAGNOSIS — M7989 Other specified soft tissue disorders: Secondary | ICD-10-CM | POA: Diagnosis not present

## 2022-01-21 DIAGNOSIS — L03114 Cellulitis of left upper limb: Secondary | ICD-10-CM

## 2022-01-21 MED ORDER — AMOXICILLIN-POT CLAVULANATE 875-125 MG PO TABS: 1.0000 | ORAL_TABLET | Freq: Two times a day (BID) | ORAL | 0 refills | Status: AC

## 2022-01-21 MED ORDER — CEFTRIAXONE SODIUM 1 G IJ SOLR
1.0000 g | Freq: Once | INTRAMUSCULAR | Status: AC
  Administered 2022-01-21: 1 g via INTRAMUSCULAR

## 2022-01-21 NOTE — Progress Notes (Signed)
No chief complaint on file.   HPI  Patient presents today for 4 days of swelling and pain at left elbow. Had tattoo of left forearm 3 weeks ago. Has been favoring it by supporting the forearm with placing the elbow on surfaces to hold up the forearm  PMH: Smoking status noted ROS: Per HPI  Objective: BP 120/71   Pulse (!) 106   Temp 98.8 F (37.1 C)   Ht 5\' 10"  (1.778 m)   Wt 179 lb (81.2 kg)   SpO2 96%   BMI 25.68 kg/m  Gen: NAD, alert, cooperative with exam HEENT: NCAT, EOMI, PERRL CV: RRR, good S1/S2, no murmur Resp: CTABL, no wheezes, non-labored Ext: 2+ edema and erythema of left olecranon surface of the elbow. Neuro: Alert and oriented, No gross deficits  Assessment and plan:  1. Left elbow pain   2. Cellulitis of left upper extremity     Meds ordered this encounter  Medications   cefTRIAXone (ROCEPHIN) injection 1 g   amoxicillin-clavulanate (AUGMENTIN) 875-125 MG tablet    Sig: Take 1 tablet by mouth 2 (two) times daily. Take all of this medication    Dispense:  20 tablet    Refill:  0    Orders Placed This Encounter  Procedures   DG Elbow 2 Views Left    Standing Status:   Future    Number of Occurrences:   1    Standing Expiration Date:   02/21/2022    Order Specific Question:   Reason for Exam (SYMPTOM  OR DIAGNOSIS REQUIRED)    Answer:   redness and pain at left elbow, swollen    Order Specific Question:   Preferred imaging location?    Answer:   Internal   Off work today through 10/13 Follow up as needed.  Claretta Fraise, MD

## 2022-01-21 NOTE — Telephone Encounter (Signed)
Called patient for [facility] - patient left ED without being seen. Worked patient in for appt today 01/21/22 with [facility].

## 2022-01-22 ENCOUNTER — Ambulatory Visit: Payer: BC Managed Care – PPO | Admitting: Nurse Practitioner

## 2022-01-22 DIAGNOSIS — E1065 Type 1 diabetes mellitus with hyperglycemia: Secondary | ICD-10-CM

## 2022-03-25 ENCOUNTER — Ambulatory Visit (INDEPENDENT_AMBULATORY_CARE_PROVIDER_SITE_OTHER): Payer: BC Managed Care – PPO | Admitting: Nurse Practitioner

## 2022-03-25 ENCOUNTER — Encounter: Payer: Self-pay | Admitting: Nurse Practitioner

## 2022-03-25 VITALS — BP 116/74 | HR 108 | Ht 70.0 in | Wt 178.2 lb

## 2022-03-25 DIAGNOSIS — E1065 Type 1 diabetes mellitus with hyperglycemia: Secondary | ICD-10-CM | POA: Diagnosis not present

## 2022-03-25 DIAGNOSIS — E109 Type 1 diabetes mellitus without complications: Secondary | ICD-10-CM

## 2022-03-25 LAB — POCT GLYCOSYLATED HEMOGLOBIN (HGB A1C): Hemoglobin A1C: 10.2 % — AB (ref 4.0–5.6)

## 2022-03-25 MED ORDER — LEVEMIR FLEXTOUCH 100 UNIT/ML ~~LOC~~ SOPN: 34.0000 [IU] | PEN_INJECTOR | Freq: Every day | SUBCUTANEOUS | 5 refills | Status: DC

## 2022-03-25 MED ORDER — NOVOLOG FLEXPEN 100 UNIT/ML ~~LOC~~ SOPN: 1.0000 [IU] | PEN_INJECTOR | Freq: Three times a day (TID) | SUBCUTANEOUS | 1 refills | Status: DC
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
## 2022-04-24 DIAGNOSIS — Z6825 Body mass index (BMI) 25.0-25.9, adult: Secondary | ICD-10-CM | POA: Diagnosis not present

## 2022-04-24 DIAGNOSIS — R059 Cough, unspecified: Secondary | ICD-10-CM | POA: Diagnosis not present

## 2022-04-24 DIAGNOSIS — Z20828 Contact with and (suspected) exposure to other viral communicable diseases: Secondary | ICD-10-CM | POA: Diagnosis not present

## 2022-06-24 ENCOUNTER — Ambulatory Visit: Payer: BC Managed Care – PPO | Admitting: Nurse Practitioner

## 2022-06-24 DIAGNOSIS — E109 Type 1 diabetes mellitus without complications: Secondary | ICD-10-CM

## 2022-06-24 DIAGNOSIS — E1065 Type 1 diabetes mellitus with hyperglycemia: Secondary | ICD-10-CM

## 2022-06-30 DIAGNOSIS — E103393 Type 1 diabetes mellitus with moderate nonproliferative diabetic retinopathy without macular edema, bilateral: Secondary | ICD-10-CM | POA: Diagnosis not present

## 2022-06-30 DIAGNOSIS — Z794 Long term (current) use of insulin: Secondary | ICD-10-CM | POA: Diagnosis not present

## 2022-07-16 ENCOUNTER — Other Ambulatory Visit: Payer: Self-pay | Admitting: Nurse Practitioner

## 2022-07-16 DIAGNOSIS — E109 Type 1 diabetes mellitus without complications: Secondary | ICD-10-CM

## 2022-08-29 ENCOUNTER — Other Ambulatory Visit: Payer: Self-pay | Admitting: "Endocrinology

## 2022-10-09 ENCOUNTER — Telehealth: Payer: Self-pay | Admitting: Nurse Practitioner

## 2022-10-09 DIAGNOSIS — E109 Type 1 diabetes mellitus without complications: Secondary | ICD-10-CM

## 2022-10-09 NOTE — Telephone Encounter (Signed)
Pt is asking for refills on his Novolog and Levemier with the pen needles. He has had 3 no shows but have scheduled him for next appt in August. Please Advise

## 2022-10-10 MED ORDER — BD PEN NEEDLE SHORT U/F 31G X 8 MM MISC: 2 refills | Status: AC

## 2022-10-10 MED ORDER — LEVEMIR FLEXPEN 100 UNIT/ML ~~LOC~~ SOPN: 34.0000 [IU] | PEN_INJECTOR | Freq: Every day | SUBCUTANEOUS | 0 refills | Status: AC

## 2022-10-10 MED ORDER — NOVOLOG FLEXPEN 100 UNIT/ML ~~LOC~~ SOPN: 1.0000 [IU] | PEN_INJECTOR | Freq: Three times a day (TID) | SUBCUTANEOUS | 0 refills | Status: AC

## 2022-10-10 NOTE — Telephone Encounter (Signed)
I have sent in limited refill for him to hold him until his appt.  Make sure he is aware he needs to keep this appointment as I will not refill his medications any longer unless he does.

## 2022-10-10 NOTE — Telephone Encounter (Signed)
Left VM of message below and also that if he no shows the appt we would be sending him a dismissal letter

## 2022-12-09 ENCOUNTER — Ambulatory Visit: Payer: BC Managed Care – PPO | Admitting: Nurse Practitioner

## 2023-01-13 ENCOUNTER — Other Ambulatory Visit: Payer: Self-pay | Admitting: "Endocrinology

## 2023-01-13 DIAGNOSIS — R5383 Other fatigue: Secondary | ICD-10-CM

## 2023-01-13 DIAGNOSIS — E1065 Type 1 diabetes mellitus with hyperglycemia: Secondary | ICD-10-CM

## 2023-01-21 ENCOUNTER — Encounter: Payer: Self-pay | Admitting: Nurse Practitioner

## 2023-01-21 ENCOUNTER — Telehealth: Payer: Self-pay | Admitting: Nurse Practitioner

## 2023-01-21 ENCOUNTER — Ambulatory Visit: Payer: BC Managed Care – PPO | Admitting: Nurse Practitioner

## 2023-01-21 DIAGNOSIS — E1065 Type 1 diabetes mellitus with hyperglycemia: Secondary | ICD-10-CM

## 2023-01-21 NOTE — Telephone Encounter (Signed)
Patient is being dismissed from the practice.  He has no showed 3 appointments.

## 2023-03-13 ENCOUNTER — Other Ambulatory Visit: Payer: Self-pay | Admitting: Nurse Practitioner

## 2023-03-16 NOTE — Telephone Encounter (Signed)
Pt has been dismissed per [facility] back in October

## 2023-06-11 ENCOUNTER — Other Ambulatory Visit: Payer: Self-pay | Admitting: "Endocrinology
